# Patient Record
Sex: Male | Born: 1970 | Race: White | Hispanic: No | Marital: Married
Health system: Southern US, Community
[De-identification: ages and names within clinical notes are randomized; demographics above are authoritative.]

---

## 2015-02-24 ENCOUNTER — Ambulatory Visit
Admit: 2015-02-24 | Disposition: A | Discharge status: Home / Self Care | Payer: Self-pay | Attending: Internal Medicine | Admitting: Internal Medicine

## 2015-12-17 IMAGING — CT CT ABD-PELV W/ CM
2 of 5 series · 17 of 46 positions shown, 19 images · IV contrast (omnipaque)
Comparison: None.

CLINICAL DATA: Left lower quadrant pain intermittently omen no
injury

EXAM:
CT ABDOMEN AND PELVIS WITH CONTRAST
TECHNIQUE: Multidetector CT imaging of the abdomen and pelvis was performed
using the standard protocol following bolus administration of
intravenous contrast.
CONTRAST:  100 cc Omnipaque 350

[Series 2: axial soft tissue · axial · 0.73mm/px · z∈[-1001,-571]mm · 14 of 98 slices shown, 16 images]
[im 6/98  soft-tissue]
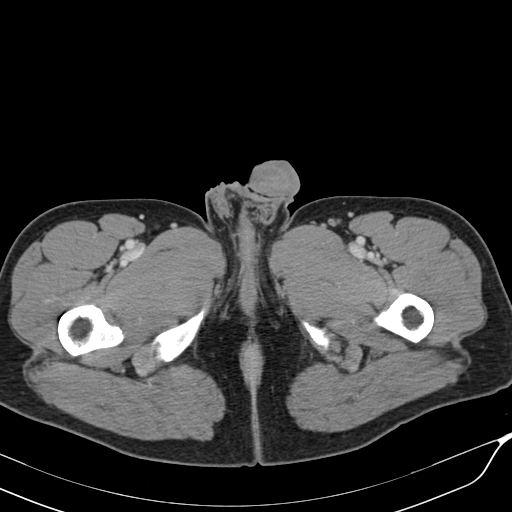
[im 6/98  bone]
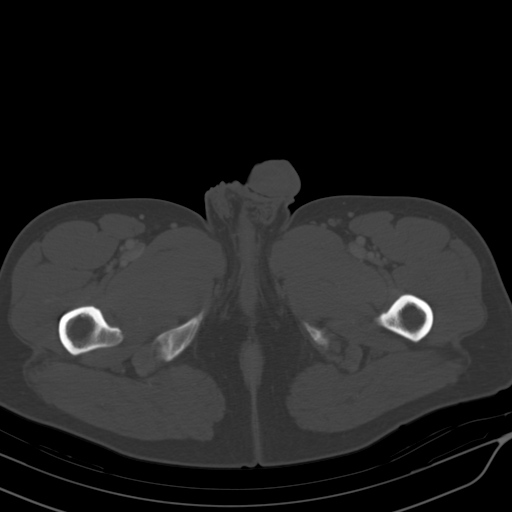
[im 11/98  soft-tissue]
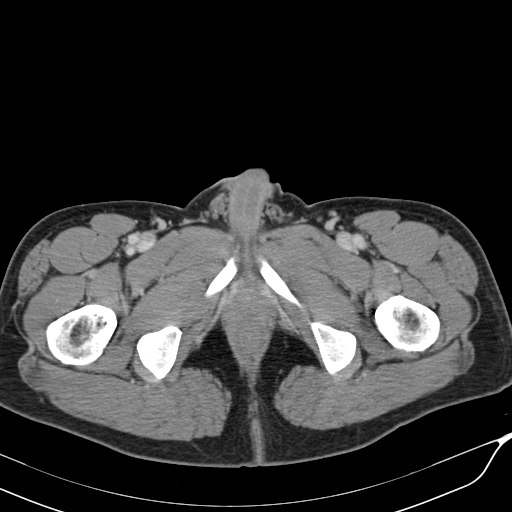
[im 22/98  soft-tissue]
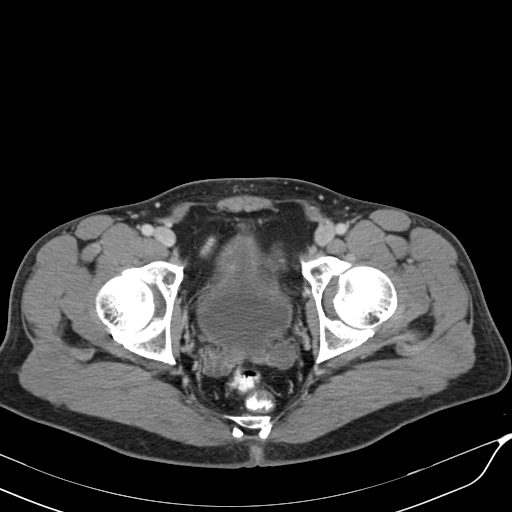
[im 27/98  soft-tissue]
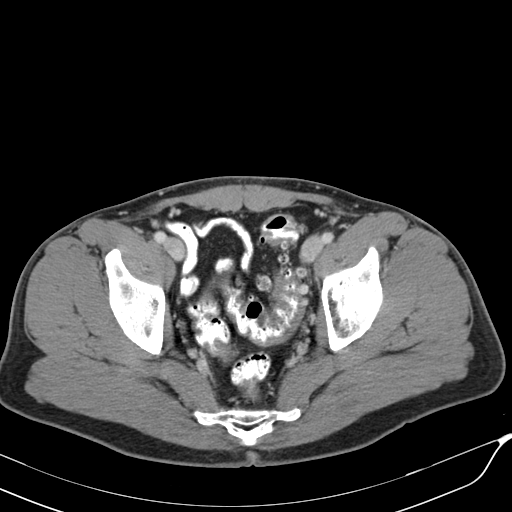
[im 33/98  soft-tissue]
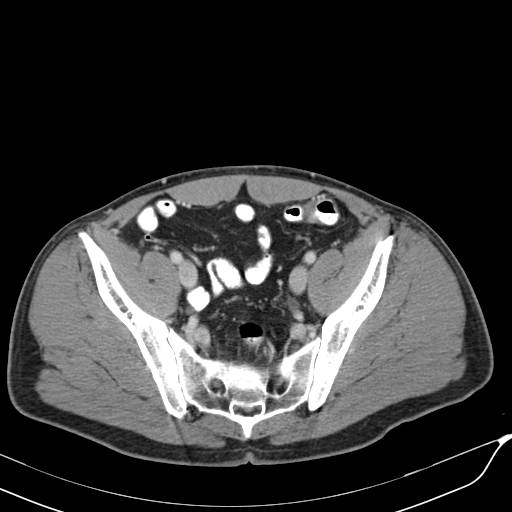
[im 38/98  soft-tissue]
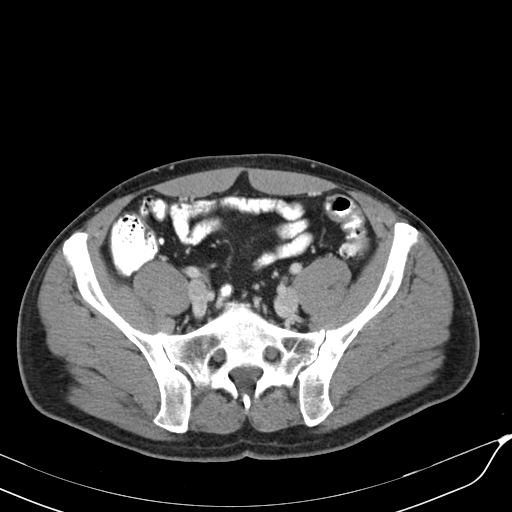
[im 44/98  soft-tissue]
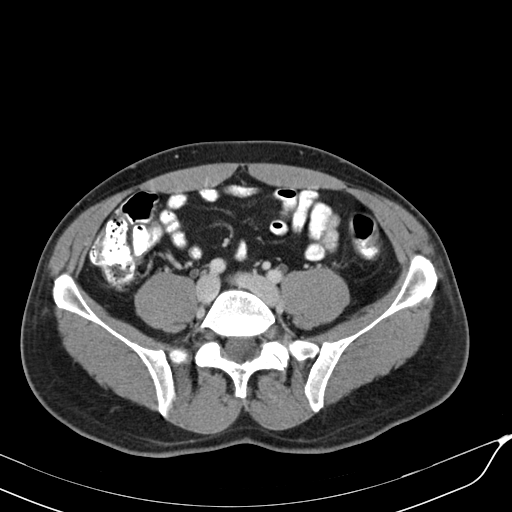
[im 54/98  soft-tissue]
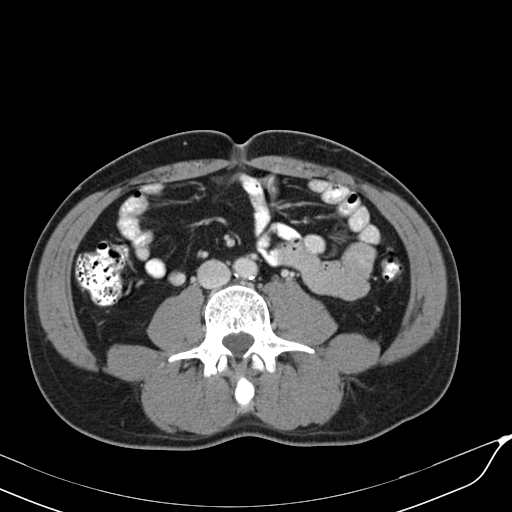
[im 60/98  soft-tissue]
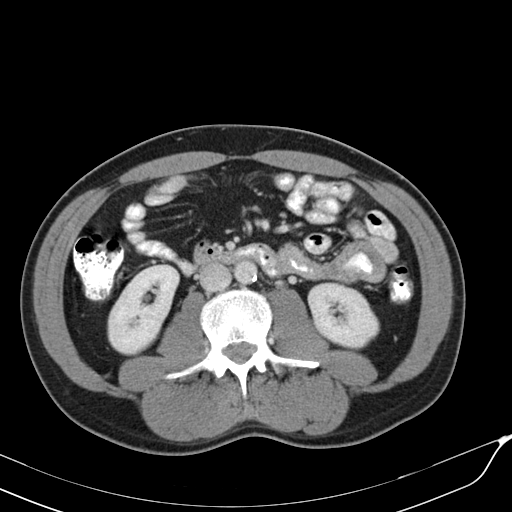
[im 60/98  bone]
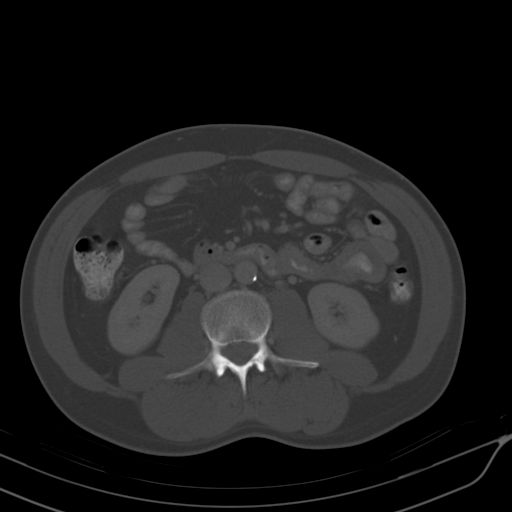
[im 65/98  soft-tissue]
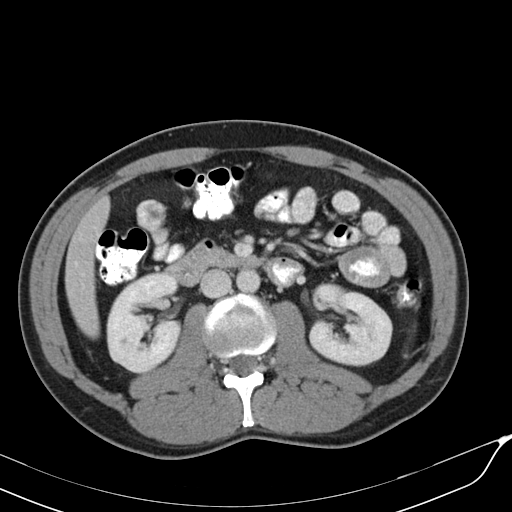
[im 71/98  soft-tissue]
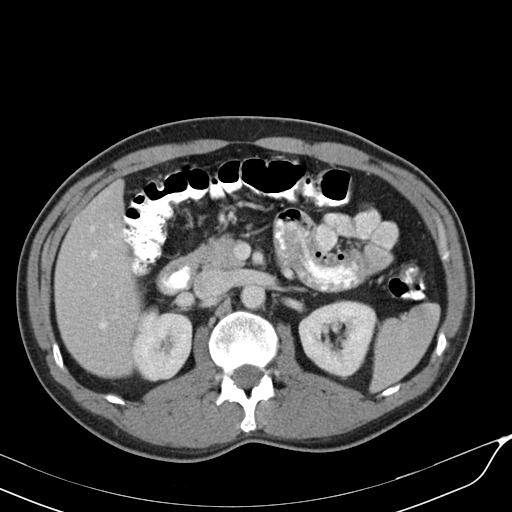
[im 76/98  soft-tissue]
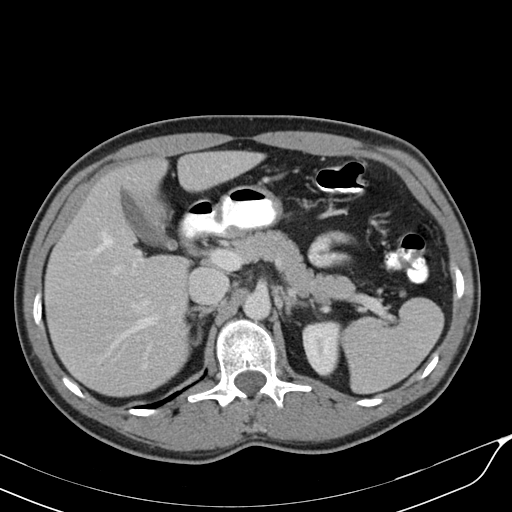
[im 87/98  soft-tissue]
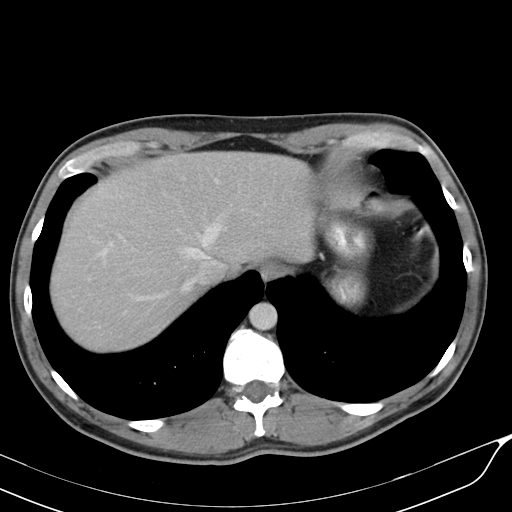
[im 92/98  soft-tissue]
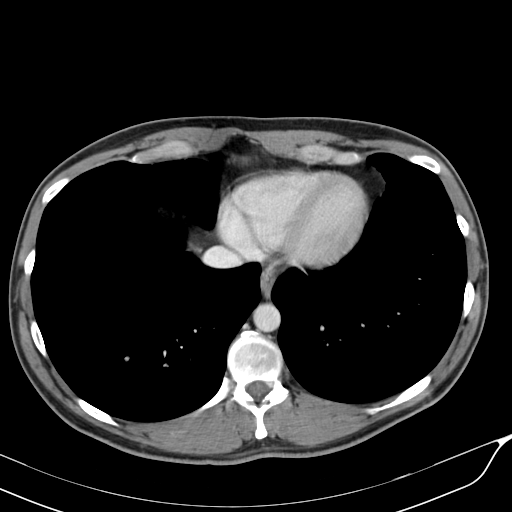

[Series 602: coronal · coronal · 0.95mm/px · 3 of 105 slices shown]
[im 35/105  soft-tissue]
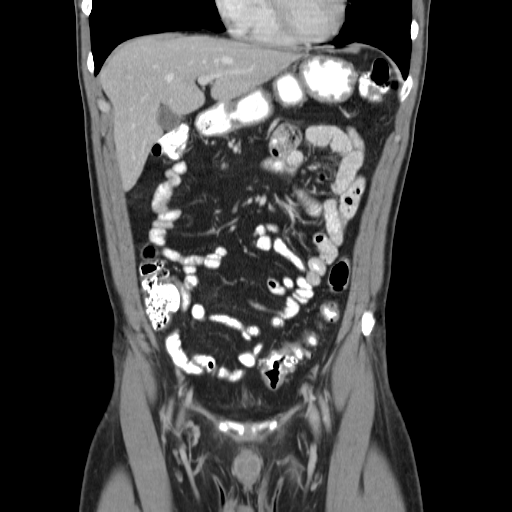
[im 47/105  soft-tissue]
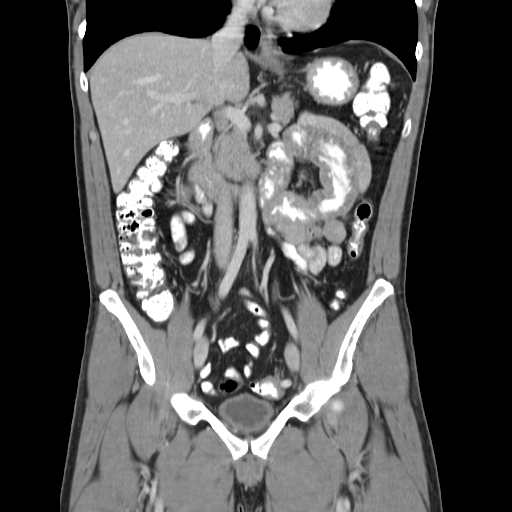
[im 58/105  soft-tissue]
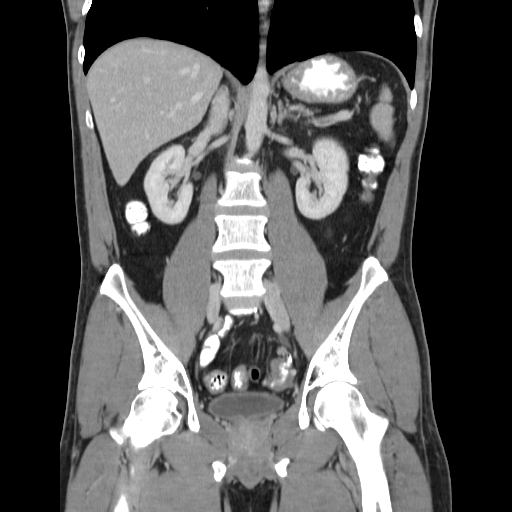

[17 of 46 positions shown; findings below may reference images not displayed]

FINDINGS: The lung bases are clear. The liver enhances with no focal
abnormality and no ductal dilatation is seen. No calcified
gallstones are noted. The pancreas is normal in size and the
pancreatic duct is not dilated. The adrenal glands and spleen are
unremarkable. The stomach is moderately fluid distended with no
abnormality evident. The kidneys enhance with no calculus or mass
and on delayed images, the pelvocaliceal systems are unremarkable
and the proximal ureters are normal in caliber. The abdominal aorta
is normal in caliber. No adenopathy is seen.

The urinary bladder is not well distended and is slightly thick
walled. The prostate is normal in size. There are multiple
rectosigmoid colon diverticula with rectosigmoid colon
diverticulosis present. No diverticulitis is evident. There are
scattered diverticula within the more proximal colon as well. The
terminal ileum is unremarkable. The appendix fills with contrast and
air and is unremarkable. The lumbar vertebrae are in normal
alignment with normal intervertebral disc spaces.
IMPRESSION: 1. Multiple colonic diverticula primarily in the rectosigmoid colon.
No present evidence of diverticulitis is seen.
2. Slightly thick-walled urinary bladder which is not well
distended.
3. The appendix and terminal ileum are unremarkable.

## 2020-03-24 ENCOUNTER — Ambulatory Visit: Payer: Self-pay | Attending: Internal Medicine

## 2020-03-24 DIAGNOSIS — Z23 Encounter for immunization: Secondary | ICD-10-CM

## 2020-03-24 NOTE — Progress Notes (Signed)
   [facility] Vaccination [facility]  Name:  Eain Mullendore    MRN: [id number] DOB: 1971/09/06  03/24/2020  Mr. Seier was observed post Covid-19 immunization for 15 minutes without incident. He was provided with Vaccine Information Sheet and instruction to access the V-Safe system.   Mr. Kissoon was instructed to call 911 with any severe reactions post vaccine: Marland Kitchen Difficulty breathing  . Swelling of face and throat  . A fast heartbeat  . A bad rash all over body  . Dizziness and weakness   Immunizations Administered    Name Date Dose VIS Date Route   Moderna COVID-19 Vaccine 03/24/2020  4:10 PM 0.5 mL 10/2019 Intramuscular   Manufacturer: Levan Hurst   Lot: [id number]   NDC: [id number]

## 2020-04-23 ENCOUNTER — Ambulatory Visit: Payer: Self-pay

## 2020-04-27 ENCOUNTER — Ambulatory Visit: Payer: Self-pay

## 2020-04-27 DIAGNOSIS — Z23 Encounter for immunization: Secondary | ICD-10-CM

## 2020-04-27 NOTE — Progress Notes (Signed)
° °  Covid-19 Vaccination Clinic  Name:  Tannon Peerson    MRN: [id number] DOB: [birth year]  04/27/2020  Mr. Sangiovanni was observed post Covid-19 immunization for 30 minutes based on pre-vaccination screening without incident. He was provided with Vaccine Information Sheet and instruction to access the V-Safe system.   Mr. Weinert was instructed to call 911 with any severe reactions post vaccine:  Difficulty breathing   Swelling of face and throat   A fast heartbeat   A bad rash all over body   Dizziness and weakness   Immunizations Administered    Name Date Dose VIS Date Route   Moderna COVID-19 Vaccine 04/27/2020  3:25 PM 0.5 mL 10/2019 Intramuscular   Manufacturer: Moderna   Lot: [id number]   [place]: [id number]
